# Patient Record
Sex: Female | Born: 1975 | Hispanic: Yes | Marital: Married | Smoking: Never smoker
Health system: Southern US, Community
[De-identification: ages and names within clinical notes are randomized; demographics above are authoritative.]

## PROBLEM LIST (undated history)

## (undated) DIAGNOSIS — G4730 Sleep apnea, unspecified: Secondary | ICD-10-CM

## (undated) DIAGNOSIS — R7303 Prediabetes: Secondary | ICD-10-CM

## (undated) HISTORY — DX: Sleep apnea, unspecified: G47.30

## (undated) HISTORY — DX: Prediabetes: R73.03

---

## 2001-01-13 HISTORY — PX: MOUTH SURGERY: SHX715

## 2001-01-22 ENCOUNTER — Other Ambulatory Visit: Admission: RE | Admit: 2001-01-22 | Discharge: 2001-01-22 | Payer: Self-pay | Admitting: Obstetrics and Gynecology

## 2002-02-11 ENCOUNTER — Other Ambulatory Visit: Admission: RE | Admit: 2002-02-11 | Discharge: 2002-02-11 | Payer: Self-pay | Admitting: Obstetrics and Gynecology

## 2002-08-29 ENCOUNTER — Other Ambulatory Visit: Admission: RE | Admit: 2002-08-29 | Discharge: 2002-08-29 | Payer: Self-pay | Admitting: Obstetrics and Gynecology

## 2003-04-05 ENCOUNTER — Other Ambulatory Visit: Admission: RE | Admit: 2003-04-05 | Discharge: 2003-04-05 | Payer: Self-pay | Admitting: Obstetrics and Gynecology

## 2003-09-28 ENCOUNTER — Other Ambulatory Visit: Admission: RE | Admit: 2003-09-28 | Discharge: 2003-09-28 | Payer: Self-pay | Admitting: Obstetrics and Gynecology

## 2004-02-10 ENCOUNTER — Inpatient Hospital Stay (HOSPITAL_COMMUNITY): Admission: AD | Admit: 2004-02-10 | Discharge: 2004-02-10 | Payer: Self-pay | Admitting: Obstetrics and Gynecology

## 2004-04-24 ENCOUNTER — Other Ambulatory Visit: Admission: RE | Admit: 2004-04-24 | Discharge: 2004-04-24 | Payer: Self-pay | Admitting: Obstetrics and Gynecology

## 2004-10-15 ENCOUNTER — Other Ambulatory Visit: Admission: RE | Admit: 2004-10-15 | Discharge: 2004-10-15 | Payer: Self-pay | Admitting: Obstetrics and Gynecology

## 2007-05-23 ENCOUNTER — Inpatient Hospital Stay (HOSPITAL_COMMUNITY): Admission: AD | Admit: 2007-05-23 | Discharge: 2007-05-25 | Payer: Self-pay | Admitting: Obstetrics and Gynecology

## 2008-06-28 ENCOUNTER — Encounter: Payer: Self-pay | Admitting: Gynecology

## 2008-06-28 ENCOUNTER — Ambulatory Visit: Payer: Self-pay | Admitting: Gynecology

## 2008-06-28 ENCOUNTER — Other Ambulatory Visit: Admission: RE | Admit: 2008-06-28 | Discharge: 2008-06-28 | Payer: Self-pay | Admitting: Gynecology

## 2008-07-14 ENCOUNTER — Ambulatory Visit: Payer: Self-pay | Admitting: Gynecology

## 2008-09-01 ENCOUNTER — Ambulatory Visit: Payer: Self-pay | Admitting: Gynecology

## 2009-07-13 ENCOUNTER — Ambulatory Visit: Payer: Self-pay | Admitting: Gynecology

## 2009-07-13 ENCOUNTER — Other Ambulatory Visit: Admission: RE | Admit: 2009-07-13 | Discharge: 2009-07-13 | Payer: Self-pay | Admitting: Gynecology

## 2009-07-17 ENCOUNTER — Encounter: Admission: RE | Admit: 2009-07-17 | Discharge: 2009-07-17 | Payer: Self-pay | Admitting: Gynecology

## 2010-08-09 ENCOUNTER — Ambulatory Visit (INDEPENDENT_AMBULATORY_CARE_PROVIDER_SITE_OTHER): Payer: BC Managed Care – PPO | Admitting: Gynecology

## 2010-08-09 ENCOUNTER — Encounter: Payer: Self-pay | Admitting: Gynecology

## 2010-08-09 ENCOUNTER — Other Ambulatory Visit (HOSPITAL_COMMUNITY)
Admission: RE | Admit: 2010-08-09 | Discharge: 2010-08-09 | Disposition: A | Discharge status: Home / Self Care | Payer: BC Managed Care – PPO | Source: Ambulatory Visit | Attending: Gynecology | Admitting: Gynecology

## 2010-08-09 VITALS — BP 120/84 | Ht 61.25 in | Wt 146.0 lb

## 2010-08-09 DIAGNOSIS — Z01419 Encounter for gynecological examination (general) (routine) without abnormal findings: Secondary | ICD-10-CM | POA: Insufficient documentation

## 2010-08-09 DIAGNOSIS — Z113 Encounter for screening for infections with a predominantly sexual mode of transmission: Secondary | ICD-10-CM

## 2010-08-09 DIAGNOSIS — R82998 Other abnormal findings in urine: Secondary | ICD-10-CM

## 2010-08-09 DIAGNOSIS — N920 Excessive and frequent menstruation with regular cycle: Secondary | ICD-10-CM

## 2010-08-09 DIAGNOSIS — B373 Candidiasis of vulva and vagina: Secondary | ICD-10-CM

## 2010-08-09 LAB — RPR

## 2010-08-09 MED ORDER — FLUCONAZOLE 150 MG PO TABS: 150.0000 mg | ORAL_TABLET | Freq: Once | ORAL | Status: AC

## 2010-08-09 MED ORDER — TRANEXAMIC ACID 650 MG PO TABS: ORAL_TABLET | ORAL | Status: DC

## 2010-08-09 NOTE — Progress Notes (Signed)
Addended by: Landis Martins R on: 08/09/2010 02:18 PM   Modules accepted: Orders

## 2010-08-09 NOTE — Progress Notes (Signed)
Subjective:     Brandy Alvarez is a 35 y.o. female here for a routine exam.  Current complaints: Dysmenorrhea and menorrhagia. The patient also interested in having an STD screen.  Personal health questionnaire reviewed: yes.   Gynecologic History Patient's last menstrual period was 07/25/2010. Contraception: IUD Last Pap: 2011. Results were: normal Last mammogram: 2011. Results were: normal  Obstetric History OB History    Grav Para Term Preterm Abortions TAB SAB Ect Mult Living   4 3   1  1   3      # Outc Date GA Lbr Len/2nd Wgt Sex Del Anes PTL Lv   1 PAR            2 PAR            3 PAR            4 SAB                The following portions of the patient's history were reviewed and updated as appropriate: allergies, current medications, past family history, past medical history, past social history, past surgical history and problem list.  Review of Systems A comprehensive review of systems was negative.    Objective:    BP 120/84  Ht 5' 1.25" (1.556 m)  Wt 146 lb (66.225 kg)  BMI 27.36 kg/m2  LMP 07/25/2010  General Appearance:    Alert, cooperative, no distress, appears stated age  Head:    Normocephalic, without obvious abnormality, atraumatic  Eyes:    PERRL, conjunctiva/corneas clear, EOM's intact, fundi    benign, both eyes  Ears:    Normal TM's and external ear canals, both ears  Nose:   Nares normal, septum midline, mucosa normal, no drainage    or sinus tenderness  Throat:   Lips, mucosa, and tongue normal; teeth and gums normal  Neck:   Supple, symmetrical, trachea midline, no adenopathy;    thyroid:  no enlargement/tenderness/nodules; no carotid   bruit or JVD  Back:     Symmetric, no curvature, ROM normal, no CVA tenderness  Lungs:     Clear to auscultation bilaterally, respirations unlabored  Chest Wall:    No tenderness or deformity   Heart:    Regular rate and rhythm, S1 and S2 normal, no murmur, rub   or gallop  Breast Exam:    No  tenderness, masses, or nipple abnormality  Abdomen:     Soft, non-tender, bowel sounds active all four quadrants,    no masses, no organomegaly  Genitalia:    Normal female without lesion, discharge or tenderness  Rectal:    Normal tone, normal prostate, no masses or tenderness;   guaiac negative stool  Extremities:   Extremities normal, atraumatic, no cyanosis or edema  Pulses:   2+ and symmetric all extremities  Skin:   Skin color, texture, turgor normal, no rashes or lesions  Lymph nodes:   Cervical, supraclavicular, and axillary nodes normal  Neurologic:   CNII-XII intact, normal strength, sensation and reflexes    throughout      Assessment:    Healthy female exam.the patient underwent an STD screen as follows: HIV, RPR, hepatitis B surface antigen, wet prep, GC and chlamydia culture. The wet prep demonstrated evidence of yeast infection. Prescription for Diflucan 150 mg to take one by mouth today was prescribed. For her dysmenorrhea and menorrhagia she was given a prescription for lysteda 650 mg 2 tablets 3 times a day for 5 days  with every cycle. We'll notify her if there is any abnormality of the above-mentioned test. A CBC urinalysis and Pap smear was done as well.     Plan:       as per assessment above. Patient to schedule her mammogram. She has an IUD ParaGard was placed 2 years ago. Return to the office when necessary or in one year for her annual exam.

## 2010-08-15 ENCOUNTER — Other Ambulatory Visit: Payer: Self-pay | Admitting: Gynecology

## 2010-08-15 DIAGNOSIS — Z1231 Encounter for screening mammogram for malignant neoplasm of breast: Secondary | ICD-10-CM

## 2010-09-03 ENCOUNTER — Ambulatory Visit
Admission: RE | Admit: 2010-09-03 | Discharge: 2010-09-03 | Disposition: A | Discharge status: Home / Self Care | Payer: BC Managed Care – PPO | Source: Ambulatory Visit | Attending: Gynecology | Admitting: Gynecology

## 2010-09-03 DIAGNOSIS — Z1231 Encounter for screening mammogram for malignant neoplasm of breast: Secondary | ICD-10-CM

## 2012-02-24 ENCOUNTER — Ambulatory Visit (INDEPENDENT_AMBULATORY_CARE_PROVIDER_SITE_OTHER): Payer: BC Managed Care – PPO | Admitting: Gynecology

## 2012-02-24 ENCOUNTER — Other Ambulatory Visit (HOSPITAL_COMMUNITY)
Admission: RE | Admit: 2012-02-24 | Discharge: 2012-02-24 | Disposition: A | Discharge status: Home / Self Care | Payer: BC Managed Care – PPO | Source: Ambulatory Visit | Attending: Gynecology | Admitting: Gynecology

## 2012-02-24 ENCOUNTER — Encounter: Payer: Self-pay | Admitting: Gynecology

## 2012-02-24 VITALS — BP 140/92 | Ht 61.0 in | Wt 162.0 lb

## 2012-02-24 DIAGNOSIS — Z1151 Encounter for screening for human papillomavirus (HPV): Secondary | ICD-10-CM | POA: Insufficient documentation

## 2012-02-24 DIAGNOSIS — R635 Abnormal weight gain: Secondary | ICD-10-CM

## 2012-02-24 DIAGNOSIS — Z30432 Encounter for removal of intrauterine contraceptive device: Secondary | ICD-10-CM

## 2012-02-24 DIAGNOSIS — Z833 Family history of diabetes mellitus: Secondary | ICD-10-CM | POA: Insufficient documentation

## 2012-02-24 DIAGNOSIS — Z01419 Encounter for gynecological examination (general) (routine) without abnormal findings: Secondary | ICD-10-CM

## 2012-02-24 LAB — CBC WITH DIFFERENTIAL/PLATELET
Basophils Absolute: 0 10*3/uL (ref 0.0–0.1)
Eosinophils Absolute: 0.3 10*3/uL (ref 0.0–0.7)
Eosinophils Relative: 3 % (ref 0–5)
HCT: 39.4 % (ref 36.0–46.0)
Lymphocytes Relative: 28 % (ref 12–46)
MCH: 28.8 pg (ref 26.0–34.0)
MCV: 84 fL (ref 78.0–100.0)
Monocytes Absolute: 0.5 10*3/uL (ref 0.1–1.0)
Platelets: 310 10*3/uL (ref 150–400)
RDW: 14 % (ref 11.5–15.5)
WBC: 9.8 10*3/uL (ref 4.0–10.5)

## 2012-02-24 LAB — LIPID PANEL
Cholesterol: 94 mg/dL (ref 0–200)
LDL Cholesterol: 20 mg/dL (ref 0–99)
Total CHOL/HDL Ratio: 3.2 Ratio
Triglycerides: 224 mg/dL — ABNORMAL HIGH (ref <150)
VLDL: 45 mg/dL — ABNORMAL HIGH (ref 0–40)

## 2012-02-24 NOTE — Progress Notes (Signed)
Brandy Alvarez Nov 07, 1975 [id number]   History:    37 y.o.  for annual gyn exam who has not been seen in the office since 2010. Patient remarried and wants to have children with her new spouse. Patient like to have her ParaGard T380A IUD removed. She states she has gained some weight. Her menstrual cycles otherwise regular. She had a baseline mammogram at age 42 which was normal. Patient's flu vaccine and Tdap vaccine are up-to-date. Patient with no history of abnormal Pap smears. Patient does her monthly self breast examination.  Past medical history,surgical history, family history and social history were all reviewed and documented in the EPIC chart.  Gynecologic History Patient's last menstrual period was 02/07/2012. Contraception: IUD Last Pap: 2010. Results were: normal Last mammogram: age 53. Results were: normal  Obstetric History OB History   Grav Para Term Preterm Abortions TAB SAB Ect Mult Living   4 3   1  1   3      # Outc Date GA Lbr Len/2nd Wgt Sex Del Anes PTL Lv   1 PAR            2 PAR            3 PAR            4 SAB                ROS: A ROS was performed and pertinent positives and negatives are included in the history.  GENERAL: No fevers or chills. HEENT: No change in vision, no earache, sore throat or sinus congestion. NECK: No pain or stiffness. CARDIOVASCULAR: No chest pain or pressure. No palpitations. PULMONARY: No shortness of breath, cough or wheeze. GASTROINTESTINAL: No abdominal pain, nausea, vomiting or diarrhea, melena or bright red blood per rectum. GENITOURINARY: No urinary frequency, urgency, hesitancy or dysuria. MUSCULOSKELETAL: No joint or muscle pain, no back pain, no recent trauma. DERMATOLOGIC: No rash, no itching, no lesions. ENDOCRINE: No polyuria, polydipsia, no heat or cold intolerance. No recent change in weight. HEMATOLOGICAL: No anemia or easy bruising or bleeding. NEUROLOGIC: No headache, seizures, numbness, tingling or weakness.  PSYCHIATRIC: No depression, no loss of interest in normal activity or change in sleep pattern.     Exam: chaperone present  BP 140/92  Ht 5\' 1"  (1.549 m)  Wt 162 lb (73.483 kg)  BMI 30.63 kg/m2  LMP 02/07/2012  Body mass index is 30.63 kg/(m^2).  General appearance : Well developed well nourished female. No acute distress HEENT: Neck supple, trachea midline, no carotid bruits, no thyroidmegaly Lungs: Clear to auscultation, no rhonchi or wheezes, or rib retractions  Heart: Regular rate and rhythm, no murmurs or gallops Breast:Examined in sitting and supine position were symmetrical in appearance, no palpable masses or tenderness,  no skin retraction, no nipple inversion, no nipple discharge, no skin discoloration, no axillary or supraclavicular lymphadenopathy Abdomen: no palpable masses or tenderness, no rebound or guarding Extremities: no edema or skin discoloration or tenderness  Pelvic:  Bartholin, Urethra, Skene Glands: Within normal limits             Vagina: No gross lesions or discharge, IUD string seen  Cervix: No gross lesions or discharge  Uterus  anteverted, normal size, shape and consistency, non-tender and mobile  Adnexa  Without masses or tenderness  Anus and perineum  normal   Rectovaginal  normal sphincter tone without palpated masses or tenderness             Hemoccult not  done   After the Pap smear was obtained and the cervix was cleansed with Betadine solution and a [place] clamp was used to grasp the IUD string. The IUD was retrieved shown to the patient and discarded.  Assessment/Plan:  37 y.o. female for annual exam who requested to have her ParaGard T380A IUD removed which it was. Patient will be started on prenatal vitamins. We discussed timing of intercourse by the utilization of ovulation predictor kit. We discussed the risk of advanced maternal age and pregnancy to include hypertension, diabetes, premature delivery, intrauterine fetal demise, and birth  defects. The following labs were ordered today: Fasting blood sugar, CBC, fasting lipid profile, urinalysis and TSH. Patient was reminded to do her monthly self breast examination.    Ok Edwards MD, 12:35 PM 02/24/2012

## 2012-02-24 NOTE — Patient Instructions (Addendum)
Media planner (Pregnancy) Si planea quedar embarazada, es una buena idea concertar una cita de preconcepcin con el mdico para poder lograr un estilo de vida saludable ante de quedar embarazada. Esto incluye dieta, peso, ejercicio, el tomar vitaminas prenatales en especial cido flico (ayuda a prevenir defectos en el cerebro y la mdula espinal), evitar el alcohol, fumar, las drogas ilegales, problemas mdicos (diabetes, convulsiones), historial familiar de problemas genticos, condiciones de trabajo e inmunizaciones. Es mejor tener conocimiento de estas cosas y Field seismologist algo antes de quedar embarazada. Si est embarazada, es necesario que siga ciertas pautas para tener un beb sano. Es muy importante Optometrist controles prenatales adecuados y seguir las indicaciones del profesional que la asiste. La atencin prenatal incluye toda la asistencia mdica que usted recibe antes del nacimiento del beb. Esto ayuda a Risk analyst y [place]. INSTRUCCIONES PARA EL CUIDADO DOMICILIARIO  Comience las consultas prenatales alrededor de la 12 semana de embarazo o lo antes posible. Al principio generalmente se programan cada mes. Se hacen ms frecuentes en los 2 ltimos meses antes del parto. Es importante que concurra a todas las citas con el profesional y siga sus instrucciones con Engineer, site a los medicamentos que deba Risk manager, a la actividad fsica y a Engineer, technical sales.  Durante el embarazo debe obtener nutrientes para usted y para su beb. Consuma una dieta normal y bien balanceada. Elija alimentos como carne, pescado, [place] y otros productos lcteos, vegetales, frutas, panes integrales y Therapist, art cul es el aumento de [place] ideal, segn su peso y Chief Strategy Officer. Beba gran cantidad de lquidos. Trate de beber 8 vasos de lquidos por Training and development officer.  El alcohol se asocia a cierto nmero de defectos del nacimiento, incluyendo el sndrome de alcoholismo fetal. Lo mejor es evitarlo  completamente El cigarrillo causa nacimientos prematuros y bebs de bajo peso al Associate Professor. El consumo de alcohol y nicotina durante el embarazo tambin aumentan marcadamente la probabilidad de que el nio sea qumicamente dependiente en etapas posteriores de su vida y puede contribuir al sndrome de muerte sbita infantil (SMSI)  No consuma drogas.  Solo tome medicamentos prescriptos o de venta libre que le haya recomendado el profesional. Algunos medicamentos pueden causar problemas genticos y fsicos al beb  Las nuseas matinales pueden aliviarse si come Firefighter en la cama. Coma dos galletitas antes de levantarse por la maana.  Las relaciones sexuales pueden continuarse hasta casi el final del embarazo, si no se presentan otros problemas como prdida prematura (antes de tiempo) de lquido amnitico, Social research officer, government vaginal, dolor durante las relaciones sexuales o dolor abdominal (en el vientre).  Practique ejercicios con regularidad. Consulte con el profesional que la asiste si no sabe con certeza si determinados ejercicios son seguros.  No utilice la baera con agua caliente, baos turcos y saunas. Estos aumentan el riesgo de sufrir un desmayo o de prdida del conocimiento, y as Glass blower/designer usted o el beb. La natacin es un buen ejercicio. Descanse todo lo que pueda e incluya una siesta despus de almorzar siempre que le sea posible, especialmente durante el tercer trimestre.  Evite los olores y las sustancias qumicas txicas.  No use zapatos de tacones altos, podra perder el equilibrio y caer.  No levante objetos de ms de 2,5 kg. Si levanta un objeto, flexione las piernas y los muslos, y no la espalda.  Evite los viajes largos, Art gallery manager trimestre.  Si debe viajar fuera de la ciudad o de su [place], lleve [place]  copia de la historia clnica. SOLICITE ATENCIN MDICA DE INMEDIATO SI:  La temperatura oral se eleva sin motivo por encima de 102 F (38.9 C) o  segn le indique el profesional que lo asiste.  Tiene una prdida de lquido por la vagina. Si sospecha una ruptura de las [place], tmese la temperatura y llame al profesional para informarlo sobre esto.  Observa unas pequeas manchas o una hemorragia vaginal Notifique al profesional acerca de la cantidad y de cuntos apsitos est utilizando.  Contina teniendo nuseas y no obtiene alivio de los [employer] han [place], o vomita sangre o una sustancia similar a la borra del caf.  Presenta un dolor en la zona superior del abdomen.  Siente molestias en el ligamento redondo en la parte abdominal baja. El profesional que la asiste Hydrologist.  Siente pequeas contracciones del tero (matriz)  No siente que el beb se mueve, o percibe menos movimientos que antes.  Siente dolor al [employer].  Brett Fairy hemorragia vaginal anormal.  Tiene diarrea persistente.  Sufre una cefalea grave.  Tiene problemas visuales.  Comienza a sentir debilidad muscular.  Se siente mareada o sufre un desmayo.  Comienza a sentir falta de aire.  Siente dolor en el pecho.  Sufre dolor en la espalda que se irradia hacia la pierna y el pie.  Siente latidos cardacos irregulares o la frecuencia cardaca es muy rpida.  Aumenta excesivamente de peso en un perodo breve (2,5 kg en 3 a 5 das)  Se ve envuelta en una situacin de violencia domstica. Document Released: 10/09/2004 Document Revised: 03/24/2011 [facility] Patient Information 2013 [place], [place]. Ovulation predictor

## 2012-02-25 LAB — URINALYSIS W MICROSCOPIC + REFLEX CULTURE
Bilirubin Urine: NEGATIVE
Casts: NONE SEEN
Crystals: NONE SEEN
Nitrite: NEGATIVE
Protein, ur: NEGATIVE mg/dL
Urobilinogen, UA: 0.2 mg/dL (ref 0.0–1.0)

## 2012-03-01 ENCOUNTER — Other Ambulatory Visit: Payer: Self-pay | Admitting: Anesthesiology

## 2012-03-01 DIAGNOSIS — E7800 Pure hypercholesterolemia, unspecified: Secondary | ICD-10-CM

## 2012-07-02 ENCOUNTER — Encounter (HOSPITAL_COMMUNITY): Payer: Self-pay | Admitting: *Deleted

## 2012-07-02 ENCOUNTER — Emergency Department (HOSPITAL_COMMUNITY)
Admission: EM | Admit: 2012-07-02 | Discharge: 2012-07-02 | Disposition: A | Discharge status: Home / Self Care | Payer: BC Managed Care – PPO | Attending: Emergency Medicine | Admitting: Emergency Medicine

## 2012-07-02 ENCOUNTER — Emergency Department (HOSPITAL_COMMUNITY): Payer: BC Managed Care – PPO

## 2012-07-02 DIAGNOSIS — Y9389 Activity, other specified: Secondary | ICD-10-CM | POA: Insufficient documentation

## 2012-07-02 DIAGNOSIS — S139XXA Sprain of joints and ligaments of unspecified parts of neck, initial encounter: Secondary | ICD-10-CM | POA: Insufficient documentation

## 2012-07-02 DIAGNOSIS — Y92410 Unspecified street and highway as the place of occurrence of the external cause: Secondary | ICD-10-CM | POA: Insufficient documentation

## 2012-07-02 DIAGNOSIS — J45909 Unspecified asthma, uncomplicated: Secondary | ICD-10-CM | POA: Insufficient documentation

## 2012-07-02 DIAGNOSIS — S161XXA Strain of muscle, fascia and tendon at neck level, initial encounter: Secondary | ICD-10-CM

## 2012-07-02 MED ORDER — HYDROCODONE-ACETAMINOPHEN 5-325 MG PO TABS: 1.0000 | ORAL_TABLET | Freq: Four times a day (QID) | ORAL | Status: DC | PRN

## 2012-07-02 MED ORDER — IBUPROFEN 800 MG PO TABS: 800.0000 mg | ORAL_TABLET | Freq: Three times a day (TID) | ORAL | Status: DC | PRN

## 2012-07-02 NOTE — ED Provider Notes (Signed)
Medical screening examination/treatment/procedure(s) were performed by non-physician practitioner and as supervising physician I was immediately available for consultation/collaboration.  Ethelda Chick, MD 07/02/12 2328
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
## 2013-11-14 ENCOUNTER — Encounter (HOSPITAL_COMMUNITY): Payer: Self-pay | Admitting: *Deleted

## 2014-03-31 ENCOUNTER — Encounter: Payer: Self-pay | Admitting: Gynecology

## 2014-04-14 ENCOUNTER — Encounter: Payer: Self-pay | Admitting: Gynecology

## 2014-04-14 ENCOUNTER — Ambulatory Visit (INDEPENDENT_AMBULATORY_CARE_PROVIDER_SITE_OTHER): Payer: 59 | Admitting: Gynecology

## 2014-04-14 VITALS — BP 118/76 | Ht 61.25 in | Wt 162.0 lb

## 2014-04-14 DIAGNOSIS — Z01419 Encounter for gynecological examination (general) (routine) without abnormal findings: Secondary | ICD-10-CM

## 2014-04-14 NOTE — Progress Notes (Signed)
Brandy Alvarez [birth year] [id number]   History:    39 y.o.  for annual gyn exam with no major complaints today. Patient was last seen in the office in 2014 at which time her Thorp IUD was removed. She reports having normal cycles every 28 days has been using no form of contraception since she is wishing to get pregnant and has not been successful. This is patient's second marriage and states that her current husband has had children with another partner in the past. She denies any prior history of STDs or any gynecological procedures. Her children were delivered vaginally. She denies any past history of any abnormal Pap smear. Her new PCP we'll be drawing her blood work next month.  Past medical history,surgical history, family history and social history were all reviewed and documented in the EPIC chart.  Gynecologic History Patient's last menstrual period was 03/31/2014. Contraception: none Last Pap: 2014. Results were: normal Last mammogram: Not indicated. Results were: Not indicated  Obstetric History OB History  Gravida Para Term Preterm AB SAB TAB Ectopic Multiple Living  _0 # Outcome Date GA Lbr Len/2nd Weight Sex Delivery Anes PTL Lv  4 SAB           3 Para           2 Para           1 Para                ROS: A ROS was performed and pertinent positives and negatives are included in the history.  GENERAL: No fevers or chills. HEENT: No change in vision, no earache, sore throat or sinus congestion. NECK: No pain or stiffness. CARDIOVASCULAR: No chest pain or pressure. No palpitations. PULMONARY: No shortness of breath, cough or wheeze. GASTROINTESTINAL: No abdominal pain, nausea, vomiting or diarrhea, melena or bright red blood per rectum. GENITOURINARY: No urinary frequency, urgency, hesitancy or dysuria. MUSCULOSKELETAL: No joint or muscle pain, no back pain, no recent trauma. DERMATOLOGIC: No rash, no itching, no lesions. ENDOCRINE: No polyuria,  polydipsia, no heat or cold intolerance. No recent change in weight. HEMATOLOGICAL: No anemia or easy bruising or bleeding. NEUROLOGIC: No headache, seizures, numbness, tingling or weakness. PSYCHIATRIC: No depression, no loss of interest in normal activity or change in sleep pattern.     Exam: chaperone present  BP 118/76 mmHg  Ht 5' 1.25" (1.556 m)  Wt 162 lb (73.483 kg)  BMI 30.35 kg/m2  LMP 03/31/2014  Body mass index is 30.35 kg/(m^2).  General appearance : Well developed well nourished female. No acute distress HEENT: Eyes: no retinal hemorrhage or exudates,  Neck supple, trachea midline, no carotid bruits, no thyroidmegaly Lungs: Clear to auscultation, no rhonchi or wheezes, or rib retractions  Heart: Regular rate and rhythm, no murmurs or gallops Breast:Examined in sitting and supine position were symmetrical in appearance, no palpable masses or tenderness,  no skin retraction, no nipple inversion, no nipple discharge, no skin discoloration, no axillary or supraclavicular lymphadenopathy Abdomen: no palpable masses or tenderness, no rebound or guarding Extremities: no edema or skin discoloration or tenderness  Pelvic:  Bartholin, Urethra, Skene Glands: Within normal limits             Vagina: No gross lesions or discharge  Cervix: No gross lesions or discharge  Uterus  anteverted, normal size, shape and consistency, non-tender and mobile  Adnexa  Without masses or tenderness  Anus and perineum  normal   Rectovaginal  normal sphincter tone without palpated masses or tenderness             Hemoccult not indicated     Assessment/Plan:  39 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 I've given her instructions to use the ovulation predictor kit for timing of intercourse and to keep a log of her menstrual cycles as well as the changes on the ovulation predictor kit as well as the timing of intercourse and to bring that to the office in 3 months if she does not  conceive. We will then need to do an HSG and a semen analysis as part of the initial evaluation for secondary infertility. Her PCP we'll be doing her blood work. Pap smear not indicated this year according to the new guidelines. We discussed importance of taking her prenatal vitamins daily.   Terrance Mass MD, 11:16 AM 04/14/2014

## 2016-05-28 ENCOUNTER — Encounter: Payer: Self-pay | Admitting: Gynecology

## 2018-09-17 ENCOUNTER — Other Ambulatory Visit: Payer: Self-pay

## 2018-09-21 ENCOUNTER — Other Ambulatory Visit: Payer: Self-pay

## 2018-09-21 ENCOUNTER — Ambulatory Visit: Payer: PRIVATE HEALTH INSURANCE | Admitting: Women's Health

## 2018-09-21 ENCOUNTER — Encounter: Payer: Self-pay | Admitting: Women's Health

## 2018-09-21 VITALS — BP 168/84 | Ht 61.0 in | Wt 179.0 lb

## 2018-09-21 DIAGNOSIS — Z01419 Encounter for gynecological examination (general) (routine) without abnormal findings: Secondary | ICD-10-CM | POA: Diagnosis not present

## 2018-09-21 NOTE — Addendum Note (Signed)
Addended by: Lorine Bears on: 09/21/2018 12:35 PM   Modules accepted: Orders

## 2018-09-21 NOTE — Patient Instructions (Addendum)
Vit D3 1000 iu daily   168/84  Health Maintenance, Female Adopting a healthy lifestyle and getting preventive care are important in promoting health and wellness. Ask your health care provider about:  The right schedule for you to have regular tests and exams.  Things you can do on your own to prevent diseases and keep yourself healthy. What should I know about diet, weight, and exercise? Eat a healthy diet   Eat a diet that includes plenty of vegetables, fruits, low-fat dairy products, and lean protein.  Do not eat a lot of foods that are high in solid fats, added sugars, or sodium. Maintain a healthy weight Body mass index (BMI) is used to identify weight problems. It estimates body fat based on height and weight. Your health care provider can help determine your BMI and help you achieve or maintain a healthy weight. Get regular exercise Get regular exercise. This is one of the most important things you can do for your health. Most adults should:  Exercise for at least 150 minutes each week. The exercise should increase your heart rate and make you sweat (moderate-intensity exercise).  Do strengthening exercises at least twice a week. This is in addition to the moderate-intensity exercise.  Spend less time sitting. Even light physical activity can be beneficial. Watch cholesterol and blood lipids Have your blood tested for lipids and cholesterol at 44 years of age, then have this test every 5 years. Have your cholesterol levels checked more often if:  Your lipid or cholesterol levels are high.  You are older than 44 years of age.  You are at high risk for heart disease. What should I know about cancer screening? Depending on your health history and family history, you may need to have cancer screening at various ages. This may include screening for:  Breast cancer.  Cervical cancer.  Colorectal cancer.  Skin cancer.  Lung cancer. What should I know about heart  disease, diabetes, and high blood pressure? Blood pressure and heart disease  High blood pressure causes heart disease and increases the risk of stroke. This is more likely to develop in people who have high blood pressure readings, are of African descent, or are overweight.  Have your blood pressure checked: ? Every 3-5 years if you are 51-81 years of age. ? Every year if you are 5 years old or older. Diabetes Have regular diabetes screenings. This checks your fasting blood sugar level. Have the screening done:  Once every three years after age 66 if you are at a normal weight and have a low risk for diabetes.  More often and at a younger age if you are overweight or have a high risk for diabetes. What should I know about preventing infection? Hepatitis B If you have a higher risk for hepatitis B, you should be screened for this virus. Talk with your health care provider to find out if you are at risk for hepatitis B infection. Hepatitis C Testing is recommended for:  Everyone born from [birth year] through 1965.  Anyone with known risk factors for hepatitis C. Sexually transmitted infections (STIs)  Get screened for STIs, including gonorrhea and chlamydia, if: ? You are sexually active and are younger than 44 years of age. ? You are older than 43 years of age and your health care provider tells you that you are at risk for this type of infection. ? Your sexual activity has changed since you were last screened, and you are at increased risk for  chlamydia or gonorrhea. Ask your health care provider if you are at risk.  Ask your health care provider about whether you are at high risk for HIV. Your health care provider may recommend a prescription medicine to help prevent HIV infection. If you choose to take medicine to prevent HIV, you should first get tested for HIV. You should then be tested every 3 months for as long as you are taking the medicine. Pregnancy  If you are about to stop  having your period (premenopausal) and you may become pregnant, seek counseling before you get pregnant.  Take 400 to 800 micrograms (mcg) of folic acid every day if you become pregnant.  Ask for birth control (contraception) if you want to prevent pregnancy. Osteoporosis and menopause Osteoporosis is a disease in which the bones lose minerals and strength with aging. This can result in bone fractures. If you are 65 years old or older, or if you are at risk for osteoporosis and fractures, ask your health care provider if you should:  Be screened for bone loss.  Take a calcium or vitamin D supplement to lower your risk of fractures.  Be given hormone replacement therapy (HRT) to treat symptoms of menopause. Follow these instructions at home: Lifestyle  Do not use any products that contain nicotine or tobacco, such as cigarettes, e-cigarettes, and chewing tobacco. If you need help quitting, ask your health care provider.  Do not use street drugs.  Do not share needles.  Ask your health care provider for help if you need support or information about quitting drugs. Alcohol use  Do not drink alcohol if: ? Your health care provider tells you not to drink. ? You are pregnant, may be pregnant, or are planning to become pregnant.  If you drink alcohol: ? Limit how much you use to 0-1 drink a day. ? Limit intake if you are breastfeeding.  Be aware of how much alcohol is in your drink. In the [place] one drink equals one 12 oz bottle of beer (355 mL), one 5 oz glass of wine (148 mL), or one 1 oz glass of hard liquor (44 mL). General instructions  Schedule regular health, dental, and eye exams.  Stay current with your vaccines.  Tell your health care provider if: ? You often feel depressed. ? You have ever been abused or do not feel safe at home. Summary  Adopting a healthy lifestyle and getting preventive care are important in promoting health and wellness.  Follow your health  care provider's instructions about healthy diet, exercising, and getting tested or screened for diseases.  Follow your health care provider's instructions on monitoring your cholesterol and blood pressure. This information is not intended to replace advice given to you by your health care provider. Make sure you discuss any questions you have with your health care provider. Document Released: 07/15/2010 Document Revised: 12/23/2017 Document Reviewed: 12/23/2017 Elsevier Patient Education  2020 Elsevier Inc.  

## 2018-09-21 NOTE — Progress Notes (Signed)
Brandy Alvarez 1975-01-21 [id number]    History:    Presents for annual exam, last here 2016 for annual exam with Dr. Toney Rakes.  Regular monthly 28-day cycle.  Husband prostate cancer with prostatectomy.  Rare intercourse.  Normal baseline mammogram at age 43 none since.  Normal Pap history.  Has annual exam scheduled with primary next week for labs.  Blood pressure elevated today, mother hypertension.  Past medical history, past surgical history, family history and social history were all reviewed and documented in the EPIC chart.  Works at orthopedic office.  3 children ages 70, 58 and 73, younger 2 have had Gardasil.  ROS:  A ROS was performed and pertinent positives and negatives are included.  Exam:  Vitals:   09/21/18 1152  BP: (!) 168/84  Weight: 179 lb (81.2 kg)  Height: 5\' 1"  (1.549 m)   Body mass index is 33.82 kg/m.   General appearance:  Normal Thyroid:  Symmetrical, normal in size, without palpable masses or nodularity. Respiratory  Auscultation:  Clear without wheezing or rhonchi Cardiovascular  Auscultation:  Regular rate, without rubs, murmurs or gallops  Edema/varicosities:  Not grossly evident Abdominal  Soft,nontender, without masses, guarding or rebound.  Liver/spleen:  No organomegaly noted  Hernia:  None appreciated  Skin  Inspection:  Grossly normal   Breasts: Examined lying and sitting.     Right: Without masses, retractions, discharge or axillary adenopathy.     Left: Without masses, retractions, discharge or axillary adenopathy. Gentitourinary   Inguinal/mons:  Normal without inguinal adenopathy  External genitalia:  Normal  BUS/Urethra/Skene's glands:  Normal  Vagina:  Normal  Cervix:  Normal  Uterus:  normal in size, shape and contour.  Midline and mobile  Adnexa/parametria:     Rt: Without masses or tenderness.   Lt: Without masses or tenderness.  Anus and perineum: Normal  Digital rectal exam: Normal sphincter tone without palpated  masses or tenderness  Assessment/Plan:  43 y.o. [place] FG4P3 for annual exam with no complaints.  Regular monthly cycles/husband prostatectomy Primary care managing labs Elevated blood pressure Obesity Asthma  Plan:.   Reviewed elevated blood pressure today, encouraged low-sodium diet, keep scheduled follow-up with primary care and check blood pressure at work which she reports has always been in the normal range.  SBEs, reviewed importance of annual screening mammogram, breast center information given instructed to schedule.  Increase regular exercise and decrease calorie/carbs.  Will get flu vaccine at work.  Pap with HR HPV typing, new screening guidelines reviewed.    [place], 11:56 AM 09/21/2018

## 2018-09-22 LAB — PAP, TP IMAGING W/ HPV RNA, RFLX HPV TYPE 16,18/45: HPV DNA High Risk: NOT DETECTED

## 2018-09-23 LAB — URINE CULTURE
MICRO NUMBER:: 860460
SPECIMEN QUALITY:: ADEQUATE

## 2018-09-23 LAB — URINALYSIS, COMPLETE W/RFL CULTURE
Bacteria, UA: NONE SEEN /HPF
Bilirubin Urine: NEGATIVE
Glucose, UA: NEGATIVE
Hgb urine dipstick: NEGATIVE
Hyaline Cast: NONE SEEN /LPF
Ketones, ur: NEGATIVE
Leukocyte Esterase: NEGATIVE
Nitrites, Initial: NEGATIVE
Protein, ur: NEGATIVE
Specific Gravity, Urine: 1.014 (ref 1.001–1.03)
pH: 6.5 (ref 5.0–8.0)

## 2018-09-23 LAB — CULTURE INDICATED

## 2019-02-17 ENCOUNTER — Other Ambulatory Visit: Payer: Self-pay | Admitting: Nurse Practitioner

## 2019-02-17 DIAGNOSIS — Z1231 Encounter for screening mammogram for malignant neoplasm of breast: Secondary | ICD-10-CM

## 2019-03-29 ENCOUNTER — Ambulatory Visit: Payer: Self-pay

## 2019-04-27 ENCOUNTER — Other Ambulatory Visit: Payer: Self-pay

## 2019-04-27 ENCOUNTER — Ambulatory Visit
Admission: RE | Admit: 2019-04-27 | Discharge: 2019-04-27 | Disposition: A | Discharge status: Home / Self Care | Payer: No Typology Code available for payment source | Source: Ambulatory Visit | Attending: Nurse Practitioner | Admitting: Nurse Practitioner

## 2019-04-27 DIAGNOSIS — Z1231 Encounter for screening mammogram for malignant neoplasm of breast: Secondary | ICD-10-CM

## 2019-04-28 ENCOUNTER — Other Ambulatory Visit: Payer: Self-pay | Admitting: Nurse Practitioner

## 2019-04-28 DIAGNOSIS — R928 Other abnormal and inconclusive findings on diagnostic imaging of breast: Secondary | ICD-10-CM

## 2019-05-05 ENCOUNTER — Ambulatory Visit
Admission: RE | Admit: 2019-05-05 | Discharge: 2019-05-05 | Disposition: A | Discharge status: Home / Self Care | Payer: No Typology Code available for payment source | Source: Ambulatory Visit | Attending: Nurse Practitioner | Admitting: Nurse Practitioner

## 2019-05-05 ENCOUNTER — Other Ambulatory Visit: Payer: Self-pay

## 2019-05-05 DIAGNOSIS — R928 Other abnormal and inconclusive findings on diagnostic imaging of breast: Secondary | ICD-10-CM

## 2019-11-29 ENCOUNTER — Ambulatory Visit: Payer: PRIVATE HEALTH INSURANCE | Admitting: Nurse Practitioner

## 2019-11-29 ENCOUNTER — Other Ambulatory Visit: Payer: Self-pay

## 2019-11-29 ENCOUNTER — Encounter: Payer: Self-pay | Admitting: Nurse Practitioner

## 2019-11-29 VITALS — BP 180/100

## 2019-11-29 DIAGNOSIS — N939 Abnormal uterine and vaginal bleeding, unspecified: Secondary | ICD-10-CM

## 2019-11-29 DIAGNOSIS — N921 Excessive and frequent menstruation with irregular cycle: Secondary | ICD-10-CM | POA: Diagnosis not present

## 2019-11-29 MED ORDER — MEGESTROL ACETATE 40 MG PO TABS: 40.0000 mg | ORAL_TABLET | Freq: Two times a day (BID) | ORAL | 0 refills | Status: AC

## 2019-11-29 NOTE — Progress Notes (Signed)
   Acute Office Visit  Subjective:    Patient ID: Brandy Alvarez, female    DOB: January 12, 1976, 44 y.o.   MRN: [id number]   HPI 44 y.o. presents today for heavy, prolonged vaginal bleeding. History of regular cycles until March 2021 where she started having more frequent cycles, sometimes twice per month. Cycle started 11/14/2019 and has been heavy with clots most days. She is still bleeding today. 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 Denies menopausal symptoms. Mother had hysterectomy for menorrhagia in her 27s.    Review of Systems  Constitutional: Negative.   Cardiovascular: Negative for palpitations.  Endocrine: Negative for cold intolerance and heat intolerance.  Genitourinary: Positive for menstrual problem and vaginal bleeding.  Neurological: Negative for dizziness and light-headedness.       Objective:    Physical Exam Constitutional:      Appearance: Normal appearance.  Genitourinary:    General: Normal vulva.     Vagina: Bleeding (with clots) present.     Cervix: Normal.     BP (!) 180/100 (BP Location: Right Arm, Patient Position: Sitting, Cuff Size: Normal)   LMP 11/14/2019  Wt Readings from Last 3 Encounters:  09/21/18 179 lb (81.2 kg)  04/14/14 162 lb (73.5 kg)  07/02/12 160 lb (72.6 kg)        Assessment & Plan:   Problem List Items Addressed This Visit      Other   Menorrhagia - Primary   Relevant Medications   megestrol (MEGACE) 40 MG tablet   Other Relevant Orders   CBC with Differential/Platelet   Comprehensive metabolic panel   TSH   US PELVIS TRANSVAGINAL NON-OB (TV ONLY)    Other Visit Diagnoses    Abnormal uterine bleeding       Relevant Medications   megestrol (MEGACE) 40 MG tablet   Other Relevant Orders   CBC with Differential/Platelet   Comprehensive metabolic panel   TSH   US PELVIS TRANSVAGINAL NON-OB (TV ONLY)      Plan: TSH, CMP, CBC.  Megace 40 mg twice a day for 10 days to slow  bleeding.  We will schedule pelvic ultrasound.  She is agreeable to plan.    Olivia Mackie [facility], 11:44 AM 11/29/2019

## 2019-11-30 ENCOUNTER — Other Ambulatory Visit: Payer: Self-pay | Admitting: Nurse Practitioner

## 2019-11-30 DIAGNOSIS — D729 Disorder of white blood cells, unspecified: Secondary | ICD-10-CM

## 2019-11-30 DIAGNOSIS — D509 Iron deficiency anemia, unspecified: Secondary | ICD-10-CM

## 2019-11-30 LAB — COMPREHENSIVE METABOLIC PANEL
AG Ratio: 1.4 (calc) (ref 1.0–2.5)
ALT: 10 U/L (ref 6–29)
AST: 17 U/L (ref 10–35)
Albumin: 4.1 g/dL (ref 3.6–5.1)
Alkaline phosphatase (APISO): 79 U/L (ref 31–125)
BUN: 10 mg/dL (ref 7–25)
CO2: 25 mmol/L (ref 20–32)
Calcium: 9.5 mg/dL (ref 8.6–10.2)
Chloride: 101 mmol/L (ref 98–110)
Creat: 0.57 mg/dL (ref 0.50–1.10)
Globulin: 3 g/dL (calc) (ref 1.9–3.7)
Glucose, Bld: 79 mg/dL (ref 65–99)
Potassium: 4 mmol/L (ref 3.5–5.3)
Sodium: 136 mmol/L (ref 135–146)
Total Bilirubin: 0.4 mg/dL (ref 0.2–1.2)
Total Protein: 7.1 g/dL (ref 6.1–8.1)

## 2019-11-30 LAB — CBC WITH DIFFERENTIAL/PLATELET
Absolute Monocytes: 634 cells/uL (ref 200–950)
Basophils Absolute: 40 cells/uL (ref 0–200)
Basophils Relative: 0.3 %
Eosinophils Absolute: 370 cells/uL (ref 15–500)
Eosinophils Relative: 2.8 %
HCT: 33.9 % — ABNORMAL LOW (ref 35.0–45.0)
Hemoglobin: 10.8 g/dL — ABNORMAL LOW (ref 11.7–15.5)
Lymphs Abs: 3392 cells/uL (ref 850–3900)
MCH: 25.8 pg — ABNORMAL LOW (ref 27.0–33.0)
MCHC: 31.9 g/dL — ABNORMAL LOW (ref 32.0–36.0)
MCV: 81.1 fL (ref 80.0–100.0)
MPV: 10.7 fL (ref 7.5–12.5)
Monocytes Relative: 4.8 %
Neutro Abs: 8765 cells/uL — ABNORMAL HIGH (ref 1500–7800)
Neutrophils Relative %: 66.4 %
Platelets: 422 10*3/uL — ABNORMAL HIGH (ref 140–400)
RBC: 4.18 10*6/uL (ref 3.80–5.10)
RDW: 13.8 % (ref 11.0–15.0)
Total Lymphocyte: 25.7 %
WBC: 13.2 10*3/uL — ABNORMAL HIGH (ref 3.8–10.8)

## 2019-11-30 LAB — TSH: TSH: 3.73 mIU/L

## 2019-11-30 MED ORDER — FERROUS SULFATE 325 (65 FE) MG PO TBEC: 325.0000 mg | DELAYED_RELEASE_TABLET | Freq: Two times a day (BID) | ORAL | 0 refills | Status: DC

## 2019-12-15 ENCOUNTER — Encounter: Payer: Self-pay | Admitting: Nurse Practitioner

## 2019-12-15 ENCOUNTER — Other Ambulatory Visit: Payer: Self-pay

## 2019-12-15 ENCOUNTER — Ambulatory Visit: Payer: PRIVATE HEALTH INSURANCE | Admitting: Nurse Practitioner

## 2019-12-15 ENCOUNTER — Ambulatory Visit (INDEPENDENT_AMBULATORY_CARE_PROVIDER_SITE_OTHER): Payer: PRIVATE HEALTH INSURANCE

## 2019-12-15 VITALS — BP 124/80

## 2019-12-15 DIAGNOSIS — N854 Malposition of uterus: Secondary | ICD-10-CM

## 2019-12-15 DIAGNOSIS — N939 Abnormal uterine and vaginal bleeding, unspecified: Secondary | ICD-10-CM

## 2019-12-15 DIAGNOSIS — N921 Excessive and frequent menstruation with irregular cycle: Secondary | ICD-10-CM | POA: Diagnosis not present

## 2019-12-15 DIAGNOSIS — R9389 Abnormal findings on diagnostic imaging of other specified body structures: Secondary | ICD-10-CM | POA: Diagnosis not present

## 2019-12-15 DIAGNOSIS — D509 Iron deficiency anemia, unspecified: Secondary | ICD-10-CM | POA: Diagnosis not present

## 2019-12-15 MED ORDER — FERROUS SULFATE 325 (65 FE) MG PO TBEC: 325.0000 mg | DELAYED_RELEASE_TABLET | Freq: Two times a day (BID) | ORAL | 0 refills | Status: DC

## 2019-12-15 MED ORDER — MEGESTROL ACETATE 40 MG PO TABS: 40.0000 mg | ORAL_TABLET | Freq: Two times a day (BID) | ORAL | 0 refills | Status: DC

## 2019-12-15 NOTE — Progress Notes (Signed)
History: 44 year old presents today for ultrasound follow-up.  Was seen by me 11/29/2019 for irregular, heavy, prolonged vaginal bleeding.  Normal cycles until March 2021, since then cycles have become more frequent sometimes twice per month.  Last cycle lasted over 2 weeks with heavy bleeding most days, Megace prescribed at that time to control bleeding.  Hemoglobin that day was 10.8, prescribed ferrous sulfate 325 twice a day but went on vacation and plans to start today.  Denies menopausal symptoms.  Normal TSH.  Exam: Appears well Ultrasound: Anteverted uterus, normal size and shape, no myometrial masses.  Thickened endometrium - 16.8 mm, cannot rule out endometrial polyp - 12 mm versus clot.  Right ovary with collapsed C.L. -14 mm, left ovary with thin-walled cyst 28 x 22 mm, avascular, echo-free, follicle = 19 mm.  No adnexal masses, no free fluid.  Assessment: Menorrhagia with irregular cycle Abnormal pelvic ultrasound - polyp vs clot Thickened endometrium - 16.8 mm Iron deficiency anemia, unspecified iron deficiency anemia type  Plan: Discussed ultrasound findings of thickened endometrium and possible endometrial polyp.  We will schedule sonohysterogram with Dr. Seymour Bars for further evaluation.  Provided her with Megace prescription in case bleeding returns prior to ultrasound.  She will start ferrous sulfate 325 mg daily.  She is agreeable to plan.

## 2019-12-15 NOTE — Patient Instructions (Signed)
Sonohysterogram  A sonohysterogram is a procedure to examine the inside of the uterus. This exam uses sound waves that are sent to a computer to make images of the lining of the uterus (endometrium). To get the best images, a germ-free, salt-water solution (sterile saline) is put into the uterus through the vagina. You may have this procedure if you have certain reproductive problems, such as abnormal bleeding, infertility, or miscarriage. This procedure can show what may be causing these problems. Possible causes include scarring or abnormal growths such as fibroids inside your uterus. It can also show if your uterus is an abnormal shape or if the lining of the uterus is too thin. Tell a health care provider about:  All medicines you are taking, including vitamins, herbs, eye drops, creams, and over-the-counter medicines.  Any allergies you have.  Any blood disorders you have.  Any surgeries you have had.  Any medical conditions you have.  Whether you are pregnant or may be pregnant.  The date of the first day of your last period.  Any signs of infection, such as fever, pain in your lower abdomen, or abnormal discharge from your vagina. What are the risks? Generally, this is a safe procedure. However, problems may occur, including:  Abdominal pain or cramping.  Light bleeding (spotting).  Increased vaginal discharge.  Infection. What happens before the procedure?  Your health care provider may have you take an over-the-counter pain medicine.  You may be given medicine to stop any abnormal bleeding.  You may be given antibiotic medicine to help prevent infection.  You may be asked to take a pregnancy test. This is usually in the form of a urine test.  You may have a pelvic exam.  You will be asked to empty your bladder. What happens during the procedure?  You will lie down on the exam table with your feet in stirrups or with your knees bent and your feet flat on the  table.  A slender, handheld device (transducer) will be lubricated and placed into your vagina.  The transducer will be positioned to send sound waves to your uterus. The sound waves are sent to a computer and are turned into images, which your health care provider sees during the procedure.  The transducer will be removed from your vagina.  An instrument will be inserted to widen the opening of your vagina (speculum).  A swab with germ-killing solution (antiseptic) will be used to clean the opening to your uterus (cervix).  A long, thin tube (catheter) will be placed through your cervix into your uterus.  The speculum will be removed.  The transducer will be placed back into your vagina to take more images.  Your uterus will be filled with a germ-free, salt-water solution (sterile saline) through the catheter. You may feel some cramping.  A fluid that contains air bubbles may be sent through the catheter to make it easier to see the fallopian tubes.  The transducer and catheter will be removed. The procedure may vary among health care providers and hospitals. What happens after the procedure?  It is up to you to get the results of your procedure. Ask your health care provider, or the department that is doing the procedure, when your results will be ready. Summary  A sonohysterogram is a procedure that creates images of the inside of the uterus.  The risks of this procedure are very low. Most women experience cramping and spotting after the procedure.  You may need to have   a pelvic exam and take a pregnancy test before this procedure. This procedure will not be done if you are pregnant or have an infection. This information is not intended to replace advice given to you by your health care provider. Make sure you discuss any questions you have with your health care provider. Document Revised: 12/12/2016 Document Reviewed: 11/26/2015 Elsevier Patient Education  2020 Elsevier  Inc.  

## 2020-01-05 ENCOUNTER — Encounter: Payer: PRIVATE HEALTH INSURANCE | Admitting: Nurse Practitioner

## 2020-01-24 ENCOUNTER — Ambulatory Visit: Payer: PRIVATE HEALTH INSURANCE | Admitting: Obstetrics & Gynecology

## 2020-01-24 ENCOUNTER — Ambulatory Visit (INDEPENDENT_AMBULATORY_CARE_PROVIDER_SITE_OTHER): Payer: PRIVATE HEALTH INSURANCE

## 2020-01-24 ENCOUNTER — Encounter: Payer: Self-pay | Admitting: Obstetrics & Gynecology

## 2020-01-24 ENCOUNTER — Ambulatory Visit (INDEPENDENT_AMBULATORY_CARE_PROVIDER_SITE_OTHER): Payer: PRIVATE HEALTH INSURANCE | Admitting: Obstetrics & Gynecology

## 2020-01-24 ENCOUNTER — Other Ambulatory Visit: Payer: Self-pay

## 2020-01-24 DIAGNOSIS — R9389 Abnormal findings on diagnostic imaging of other specified body structures: Secondary | ICD-10-CM

## 2020-01-24 DIAGNOSIS — D509 Iron deficiency anemia, unspecified: Secondary | ICD-10-CM | POA: Diagnosis not present

## 2020-01-24 DIAGNOSIS — N921 Excessive and frequent menstruation with irregular cycle: Secondary | ICD-10-CM | POA: Diagnosis not present

## 2020-01-24 MED ORDER — NORETHINDRONE 0.35 MG PO TABS: 1.0000 | ORAL_TABLET | Freq: Every day | ORAL | 4 refills | Status: DC

## 2020-01-24 NOTE — Progress Notes (Signed)
Brandy Alvarez [birth year] [id number]        45 y.o.  [id number] Married.  Husband with Prostate Ca.  RP: Menometrorrhagia for SonoHysterogram  HPI: Menometrorrhagia x May 2021.  Heavy bleeding controled on Megace.  Pelvic US 12/15/2019 No Fibroids.  Thick Endometrium, possible endometrial Polyp.   OB History  Gravida Para Term Preterm AB Living  4 3     1 3   SAB IAB Ectopic Multiple Live Births  1            # Outcome Date GA Lbr Len/2nd Weight Sex Delivery Anes PTL Lv  4 SAB           3 Para           2 Para           1 Para             Past medical history,surgical history, problem list, medications, allergies, family history and social history were all reviewed and documented in the EPIC chart.   Directed ROS with pertinent positives and negatives documented in the history of present illness/assessment and plan.  Exam:  There were no vitals filed for this visit. General appearance:  Normal                                                                    Sono Infusion Hysterogram ( procedure note)   The initial transvaginal ultrasound demonstrated the following: T/V images.  Anteverted uterus normal in size and shape with no myometrial mass.  The endometrial thickness is measured at 10.34 mm.  Left ovary normal.  Right ovary with a small simple follicle which is avascular measured at 2.5 cm.  No adnexal mass.  No free fluid in the posterior cul-de-sac.  The speculum  was inserted and the cervix cleansed with Betadine solution after confirming that patient has no allergies.A small sonohysterography catheterwas utilized.  Insertion was facilitated with ring forceps, using a spear-like motion the catheter was inserted to the fundus of the uterus. The speculum is then removed carefully to avoid dislodging the catheter. The catheter was flushed with sterile saline delete prior to insertion to rid it of small amounts of air.the sterile saline solution was infused into the  uterine cavity as a vaginal ultrasound probe was then placed in the vagina for full visualization of the uterine cavity from a transvaginal approach. The following was noted: After distention of the intrauterine cavity with saline, no intracavitary defect is seen.  The catheter was then removed after retrieving some of the saline from the intrauterine cavity. An endometrial biopsy was not done. Patient tolerated procedure well. She had received a tablet of Aleve for discomfort.    Assessment/Plan:  45 y.o. [id number]   1. Menorrhagia with irregular cycle Menometrorrhagia controlled with Megace.  Sonohysterogram today showed no intracavitary defect.  Patient's menometrorrhagia is probably due to oligoovulation of perimenopause.  Decision to control with the progestin only birth control pill.  No contraindication.  Usage reviewed and prescription sent to pharmacy.  2. Thickened endometrium Normal endometrium.  No intracavitary lesion on sonohysterogram.  3. Iron deficiency anemia, unspecified iron deficiency anemia type We will control heavy bleeding with the progestin only birth control pill.  Patient will  continue on iron supplements.  Other orders - norethindrone (MICRONOR) 0.35 MG tablet; Take 1 tablet (0.35 mg total) by mouth daily.  Genia Del MD, 2:34 PM 01/24/2020

## 2020-02-07 ENCOUNTER — Encounter: Payer: PRIVATE HEALTH INSURANCE | Admitting: Nurse Practitioner

## 2020-02-28 ENCOUNTER — Ambulatory Visit: Payer: PRIVATE HEALTH INSURANCE | Admitting: Nurse Practitioner

## 2020-06-25 ENCOUNTER — Other Ambulatory Visit: Payer: Self-pay | Admitting: Nurse Practitioner

## 2020-06-25 DIAGNOSIS — Z1231 Encounter for screening mammogram for malignant neoplasm of breast: Secondary | ICD-10-CM

## 2020-06-29 ENCOUNTER — Other Ambulatory Visit: Payer: Self-pay

## 2020-06-29 ENCOUNTER — Ambulatory Visit
Admission: RE | Admit: 2020-06-29 | Discharge: 2020-06-29 | Disposition: A | Discharge status: Home / Self Care | Payer: No Typology Code available for payment source | Source: Ambulatory Visit | Attending: Nurse Practitioner | Admitting: Nurse Practitioner

## 2020-06-29 DIAGNOSIS — Z1231 Encounter for screening mammogram for malignant neoplasm of breast: Secondary | ICD-10-CM

## 2020-07-09 ENCOUNTER — Ambulatory Visit (INDEPENDENT_AMBULATORY_CARE_PROVIDER_SITE_OTHER): Payer: No Typology Code available for payment source | Admitting: Obstetrics & Gynecology

## 2020-07-09 ENCOUNTER — Encounter: Payer: Self-pay | Admitting: Obstetrics & Gynecology

## 2020-07-09 ENCOUNTER — Other Ambulatory Visit (HOSPITAL_COMMUNITY)
Admission: RE | Admit: 2020-07-09 | Discharge: 2020-07-09 | Disposition: A | Discharge status: Home / Self Care | Payer: No Typology Code available for payment source | Source: Ambulatory Visit | Attending: Obstetrics & Gynecology | Admitting: Obstetrics & Gynecology

## 2020-07-09 ENCOUNTER — Other Ambulatory Visit: Payer: Self-pay

## 2020-07-09 VITALS — BP 130/84 | HR 87 | Resp 16 | Ht 61.0 in | Wt 186.0 lb

## 2020-07-09 DIAGNOSIS — E6609 Other obesity due to excess calories: Secondary | ICD-10-CM

## 2020-07-09 DIAGNOSIS — N920 Excessive and frequent menstruation with regular cycle: Secondary | ICD-10-CM

## 2020-07-09 DIAGNOSIS — Z01419 Encounter for gynecological examination (general) (routine) without abnormal findings: Secondary | ICD-10-CM | POA: Diagnosis present

## 2020-07-09 DIAGNOSIS — Z6835 Body mass index (BMI) 35.0-35.9, adult: Secondary | ICD-10-CM | POA: Diagnosis not present

## 2020-07-09 MED ORDER — NORETHINDRONE 0.35 MG PO TABS: 1.0000 | ORAL_TABLET | Freq: Every day | ORAL | 4 refills | Status: DC

## 2020-07-09 NOTE — Progress Notes (Signed)
Elberta Lachapelle 1975-02-27 [id number]   History:    45 y.o. [id number]  RP:  Established patient presenting for annual gyn exam   HPI: Well on the Progestin-only BCPs to control heavy periods.  Pelvic US 12/15/2019, normal endometrial line, no Fibroids.  No pelvic pain.  No pain with IC.  Breasts normal.  Screening mammo neg 06/2020.  Mother with recent Dx of Breast Ca.  BMI 35.14.  Health labs with Fam MD.  Will schedule a screening colono.    Past medical history,surgical history, family history and social history were all reviewed and documented in the EPIC chart.  Gynecologic History Patient's last menstrual period was 06/21/2020 (exact date).  Obstetric History OB History  Gravida Para Term Preterm AB Living  4 3     1 3   SAB IAB Ectopic Multiple Live Births  1            # Outcome Date GA Lbr Len/2nd Weight Sex Delivery Anes PTL Lv  4 SAB           3 Para           2 Para           1 Para              ROS: A ROS was performed and pertinent positives and negatives are included in the history.  GENERAL: No fevers or chills. HEENT: No change in vision, no earache, sore throat or sinus congestion. NECK: No pain or stiffness. CARDIOVASCULAR: No chest pain or pressure. No palpitations. PULMONARY: No shortness of breath, cough or wheeze. GASTROINTESTINAL: No abdominal pain, nausea, vomiting or diarrhea, melena or bright red blood per rectum. GENITOURINARY: No urinary frequency, urgency, hesitancy or dysuria. MUSCULOSKELETAL: No joint or muscle pain, no back pain, no recent trauma. DERMATOLOGIC: No rash, no itching, no lesions. ENDOCRINE: No polyuria, polydipsia, no heat or cold intolerance. No recent change in weight. HEMATOLOGICAL: No anemia or easy bruising or bleeding. NEUROLOGIC: No headache, seizures, numbness, tingling or weakness. PSYCHIATRIC: No depression, no loss of interest in normal activity or change in sleep pattern.     Exam:   BP 130/84   Pulse 87   Resp 16   Ht  5\' 1"  (1.549 m)   Wt 186 lb (84.4 kg)   LMP 06/21/2020 (Exact Date)   BMI 35.14 kg/m   Body mass index is 35.14 kg/m.  General appearance : Well developed well nourished female. No acute distress HEENT: Eyes: no retinal hemorrhage or exudates,  Neck supple, trachea midline, no carotid bruits, no thyroidmegaly Lungs: Clear to auscultation, no rhonchi or wheezes, or rib retractions  Heart: Regular rate and rhythm, no murmurs or gallops Breast:Examined in sitting and supine position were symmetrical in appearance, no palpable masses or tenderness,  no skin retraction, no nipple inversion, no nipple discharge, no skin discoloration, no axillary or supraclavicular lymphadenopathy Abdomen: no palpable masses or tenderness, no rebound or guarding Extremities: no edema or skin discoloration or tenderness  Pelvic: Vulva: Normal             Vagina: No gross lesions or discharge  Cervix: No gross lesions or discharge.  Pap reflex done.  Uterus  AV, normal size, shape and consistency, non-tender and mobile  Adnexa  Without masses or tenderness  Anus: Normal   Assessment/Plan:  45 y.o. female for annual exam   1. Encounter for routine gynecological examination with Papanicolaou smear of cervix Normal gynecologic exam.  Pap reflex  done.  Breast exam normal.  Screening mammogram June 2022 was negative.  We will schedule a screening colonoscopy now.  Health labs with family nurse practitioner. - Cytology - PAP( [facility])  2. Menorrhagia with regular cycle Heavy menstrual periods controlled with the progestin only birth control pill.  No contraindication to continue.  Prescription sent to pharmacy.  3. Class 2 obesity due to excess calories without serious comorbidity with body mass index (BMI) of 35.0 to 35.9 in adult Lower calorie/carb diet recommended.  Intermittent fasting suggested.  Aerobic activities 5 times a week and light weightlifting every 2 days.  Other orders - Multiple Vitamin  (MULTIVITAMIN PO); Take by mouth. - norethindrone (MICRONOR) 0.35 MG tablet; Take 1 tablet (0.35 mg total) by mouth daily.   Genia Del MD, 2:42 PM 07/09/2020

## 2020-07-10 LAB — CYTOLOGY - PAP: Diagnosis: NEGATIVE

## 2021-07-10 ENCOUNTER — Other Ambulatory Visit: Payer: Self-pay | Admitting: Nurse Practitioner

## 2021-07-10 DIAGNOSIS — Z1231 Encounter for screening mammogram for malignant neoplasm of breast: Secondary | ICD-10-CM

## 2021-07-11 ENCOUNTER — Ambulatory Visit
Admission: RE | Admit: 2021-07-11 | Discharge: 2021-07-11 | Disposition: A | Discharge status: Home / Self Care | Payer: No Typology Code available for payment source | Source: Ambulatory Visit | Attending: Nurse Practitioner | Admitting: Nurse Practitioner

## 2021-07-11 DIAGNOSIS — Z1231 Encounter for screening mammogram for malignant neoplasm of breast: Secondary | ICD-10-CM

## 2021-07-15 ENCOUNTER — Other Ambulatory Visit: Payer: Self-pay | Admitting: Nurse Practitioner

## 2021-07-15 DIAGNOSIS — R928 Other abnormal and inconclusive findings on diagnostic imaging of breast: Secondary | ICD-10-CM

## 2021-07-23 ENCOUNTER — Ambulatory Visit
Admission: RE | Admit: 2021-07-23 | Discharge: 2021-07-23 | Disposition: A | Discharge status: Home / Self Care | Payer: No Typology Code available for payment source | Source: Ambulatory Visit | Attending: Nurse Practitioner | Admitting: Nurse Practitioner

## 2021-07-23 DIAGNOSIS — R928 Other abnormal and inconclusive findings on diagnostic imaging of breast: Secondary | ICD-10-CM

## 2021-09-19 ENCOUNTER — Other Ambulatory Visit: Payer: Self-pay | Admitting: Obstetrics & Gynecology

## 2021-10-01 NOTE — Telephone Encounter (Signed)
10/01/21: Melton Alar, CMA Left message to call and schedule

## 2021-10-01 NOTE — Telephone Encounter (Signed)
Msg sent to scheduling to contact pt to set up annual exam.

## 2021-10-07 NOTE — Telephone Encounter (Signed)
10/04/21: Brandy Alvarez D, CMA Two messages left to call and schedule appointment.

## 2022-07-10 ENCOUNTER — Other Ambulatory Visit: Payer: Self-pay | Admitting: Nurse Practitioner

## 2022-07-10 DIAGNOSIS — Z1231 Encounter for screening mammogram for malignant neoplasm of breast: Secondary | ICD-10-CM

## 2022-07-25 ENCOUNTER — Ambulatory Visit
Admission: RE | Admit: 2022-07-25 | Discharge: 2022-07-25 | Disposition: A | Discharge status: Home / Self Care | Payer: No Typology Code available for payment source | Source: Ambulatory Visit | Attending: Nurse Practitioner | Admitting: Nurse Practitioner

## 2022-07-25 DIAGNOSIS — Z1231 Encounter for screening mammogram for malignant neoplasm of breast: Secondary | ICD-10-CM

## 2022-08-14 ENCOUNTER — Encounter: Payer: Self-pay | Admitting: Radiology

## 2022-08-14 ENCOUNTER — Ambulatory Visit (INDEPENDENT_AMBULATORY_CARE_PROVIDER_SITE_OTHER): Payer: No Typology Code available for payment source | Admitting: Radiology

## 2022-08-14 VITALS — BP 138/84 | Ht 61.0 in | Wt 184.0 lb

## 2022-08-14 DIAGNOSIS — Z01419 Encounter for gynecological examination (general) (routine) without abnormal findings: Secondary | ICD-10-CM

## 2022-08-14 DIAGNOSIS — E669 Obesity, unspecified: Secondary | ICD-10-CM | POA: Diagnosis not present

## 2022-08-14 NOTE — Progress Notes (Signed)
   Brandy Alvarez 11-07-1975 [id number]   History:  47 y.o. G4P3 presents for annual exam. Struggling to lose weight with diet and exercise. Prediabetic with sleep apnea. Periods have been scant this past year.LMP 03/14/19 very light.  Gynecologic History Patient's last menstrual period was 03/14/2022 (exact date). Period Duration (Days): 2 Period Pattern: (!) Irregular Menstrual Flow: Light Menstrual Control: Thin pad Dysmenorrhea: None Contraception/Family planning: none Sexually active: yes Last Pap: 2022. Results were: normal Last mammogram: 2024. Results were: normal  Obstetric History OB History  Gravida Para Term Preterm AB Living  4 3     1 3   SAB IAB Ectopic Multiple Live Births  1            # Outcome Date GA Lbr Len/2nd Weight Sex Type Anes PTL Lv  4 SAB           3 Para           2 Para           1 Para              The following portions of the patient's history were reviewed and updated as appropriate: allergies, current medications, past family history, past medical history, past social history, past surgical history, and problem list.  Review of Systems Pertinent items noted in HPI and remainder of comprehensive ROS otherwise negative.   Past medical history, past surgical history, family history and social history were all reviewed and documented in the EPIC chart.   Exam:  Vitals:   08/14/22 0753  BP: 138/84  Weight: 184 lb (83.5 kg)  Height: 5\' 1"  (1.549 m)   Body mass index is 34.77 kg/m.  General appearance:  Normal, obese Thyroid:  Symmetrical, normal in size, without palpable masses or nodularity. Respiratory  Auscultation:  Clear without wheezing or rhonchi Cardiovascular  Auscultation:  Regular rate, without rubs, murmurs or gallops  Edema/varicosities:  Not grossly evident Abdominal  Soft,nontender, without masses, guarding or rebound.  Liver/spleen:  No organomegaly noted  Hernia:  None appreciated  Skin  Inspection:  Grossly  normal Breasts: Examined lying and sitting.   Right: Without masses, retractions, nipple discharge or axillary adenopathy.   Left: Without masses, retractions, nipple discharge or axillary adenopathy. Genitourinary   Inguinal/mons:  Normal without inguinal adenopathy  External genitalia:  Normal appearing vulva with no masses, tenderness, or lesions  BUS/Urethra/Skene's glands:  Normal without masses or exudate  Vagina:  Normal appearing with normal color and discharge, no lesions  Cervix:  Normal appearing without discharge or lesions  Uterus:  Normal in size, shape and contour.  Mobile, nontender  Adnexa/parametria:     Rt: Normal in size, without masses or tenderness.   Lt: Normal in size, without masses or tenderness.  Anus and perineum: Normal   Raynelle Fanning, CMA present for exam  Assessment/Plan:   1. Well woman exam with routine gynecological exam Pap up to date Mammo due 2025 Colonoscopy up to date Labs with PCP  2. Obesity (BMI 30.0-34.9) Continue diet and exercise Trying to reverse pre diabetes and sleep apnea Will check with insurance to see if her insurance will cover a GLP-1     Discussed SBE, colonoscopy and pap screening as directed/appropriate. Recommend of exercise weekly, including weight bearing exercise. Encouraged the use of seatbelts and sunscreen. Return in 1 year for annual or as needed.   Arlie Solomons B WHNP-BC 8:09 AM 08/14/2022

## 2023-01-13 IMAGING — MG MM DIGITAL SCREENING BILAT W/ TOMO AND CAD
6 of 10 series · 6 of 30 positions shown · non-contrast
Comparison: Previous exam(s).

CLINICAL DATA: Screening.

EXAM:
DIGITAL SCREENING BILATERAL MAMMOGRAM WITH TOMOSYNTHESIS AND CAD
TECHNIQUE: Bilateral screening digital craniocaudal and mediolateral oblique
mammograms were obtained. Bilateral screening digital breast
tomosynthesis was performed. The images were evaluated with
computer-aided detection.

[L CC synth-2D]
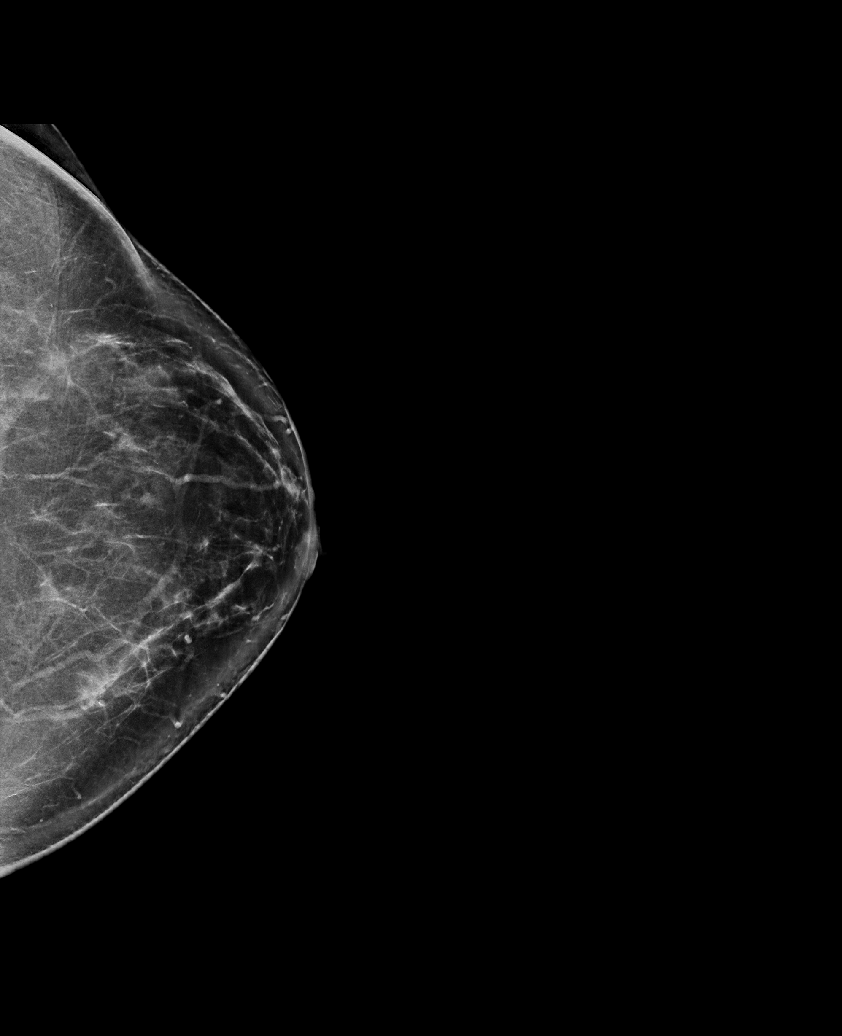

[R CC synth-2D]
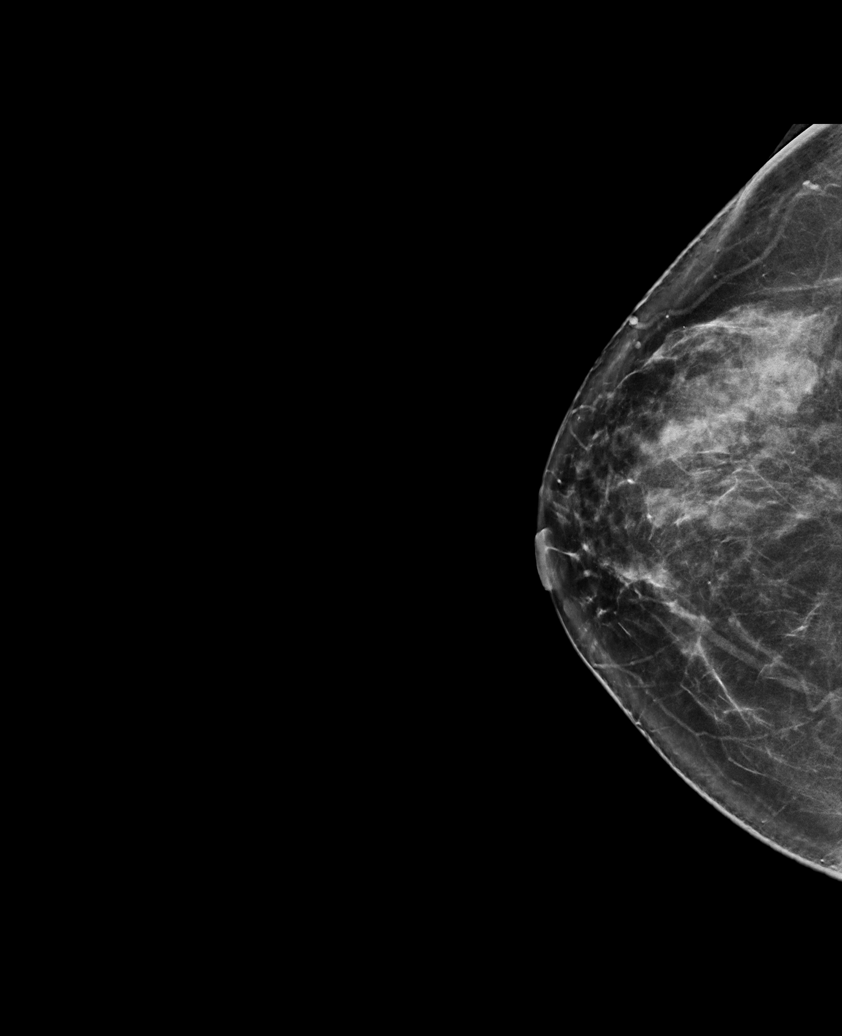

[R MLO synth-2D (1 of 2)]
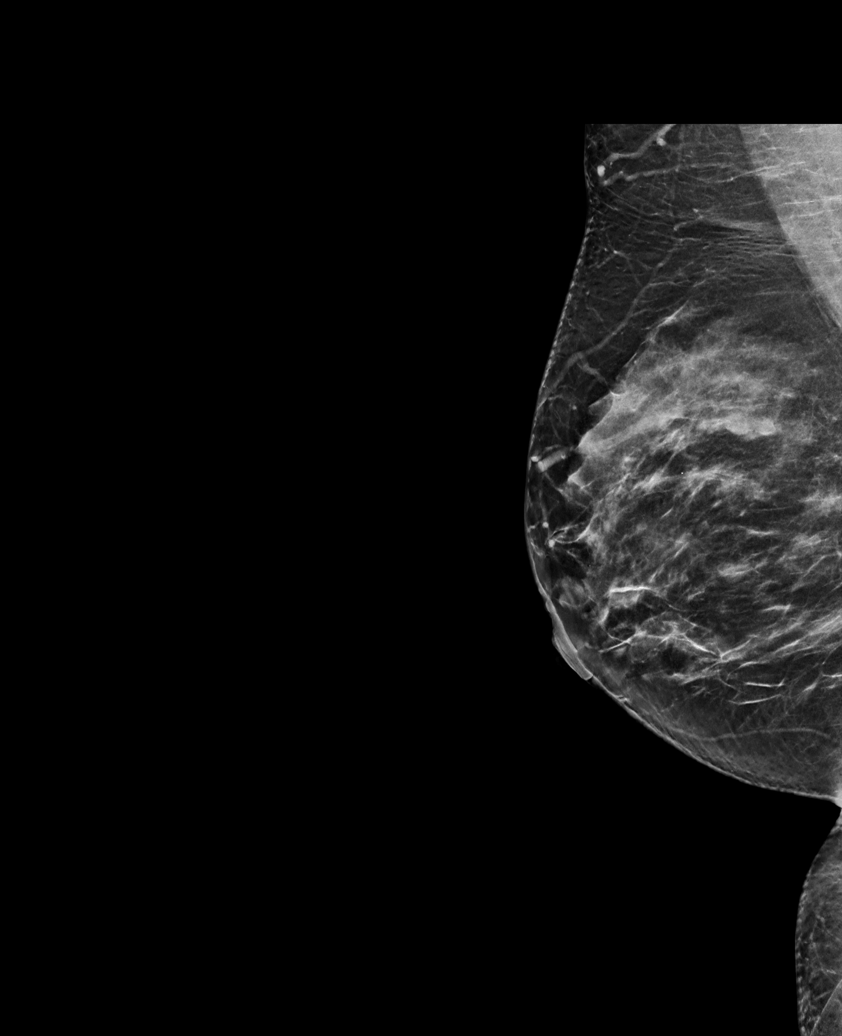

[R MLO synth-2D (2 of 2)]
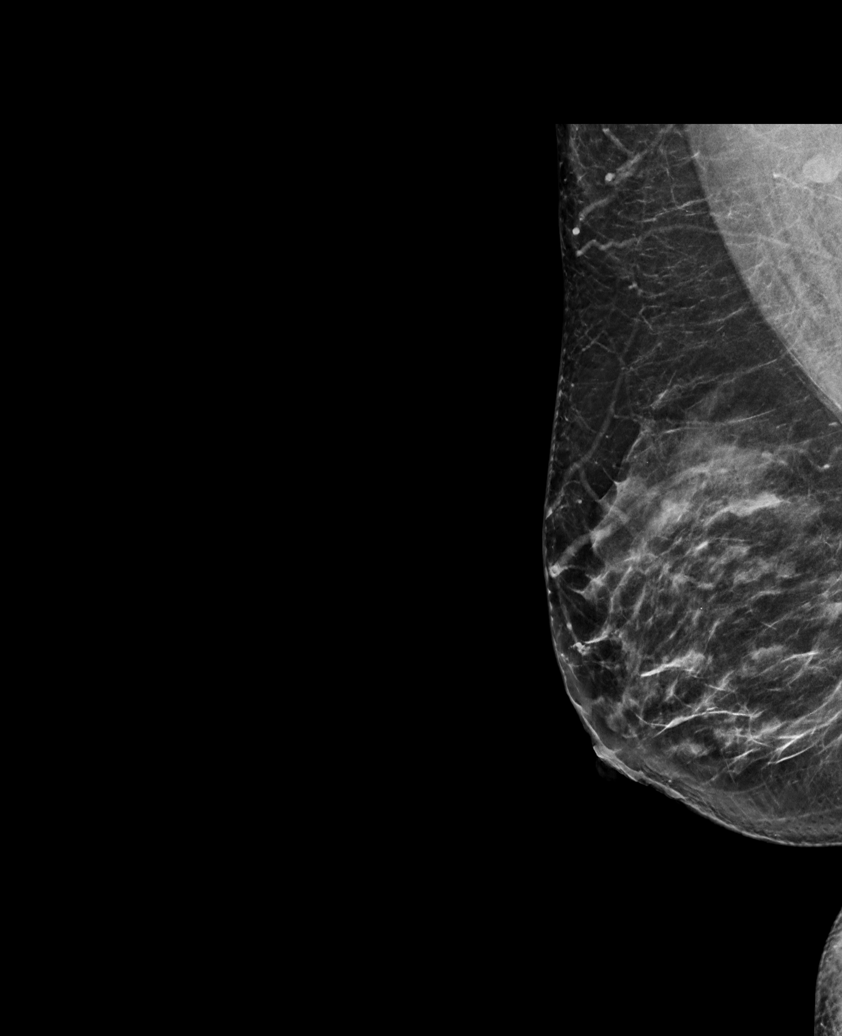

[L MLO synth-2D]
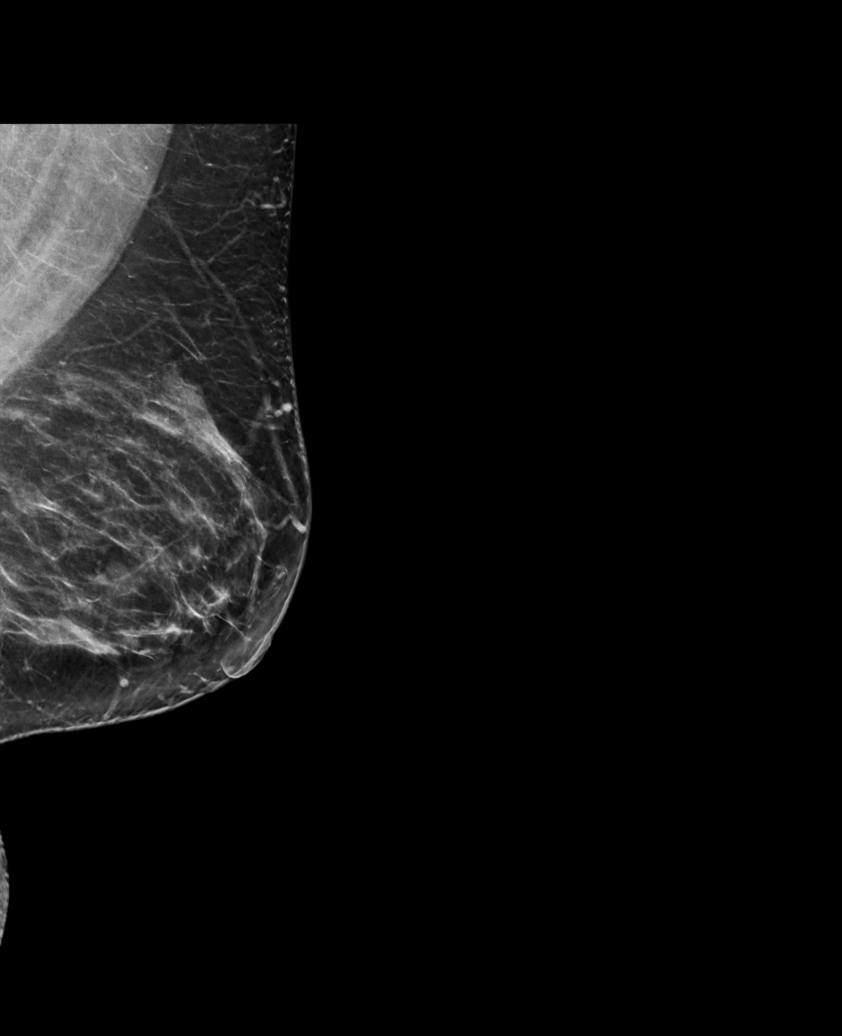

[R CC tomo · tomo slice 37/74.0]
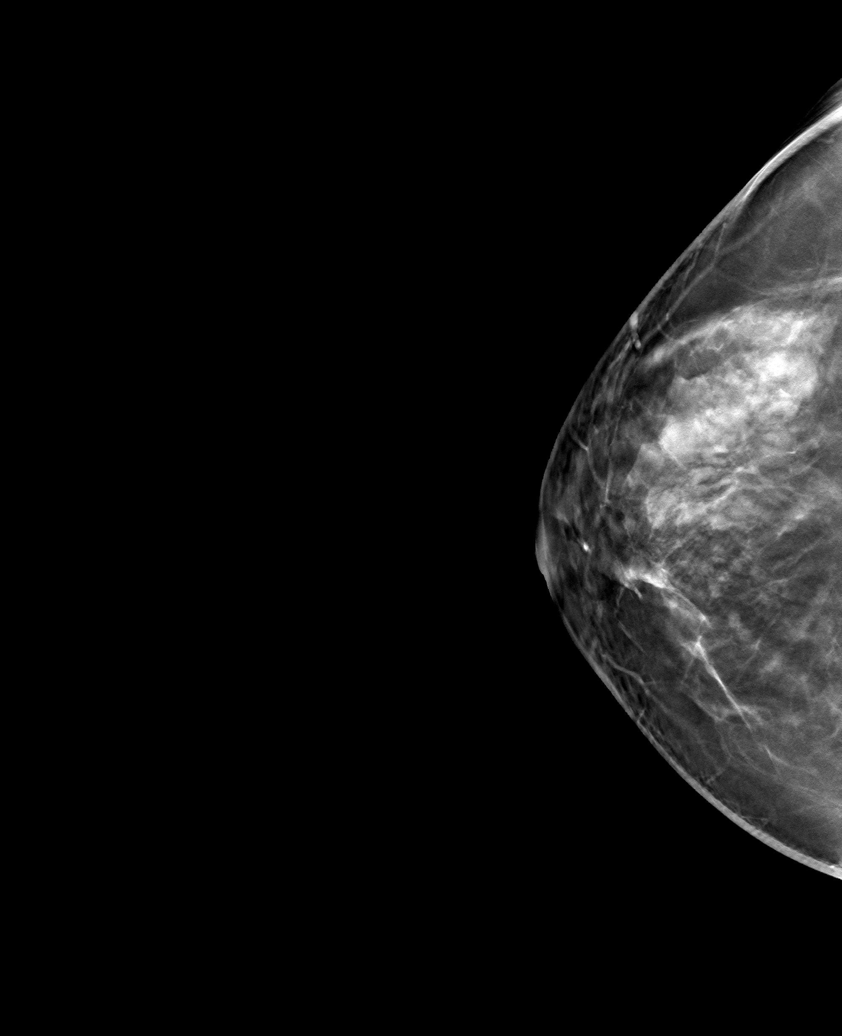

[6 of 30 positions shown; findings below may reference images not displayed]

ACR Breast Density Category c: The breast tissue is heterogeneously
dense, which may obscure small masses.
FINDINGS: There are no findings suspicious for malignancy.
IMPRESSION: No mammographic evidence of malignancy. A result letter of this
screening mammogram will be mailed directly to the patient.

RECOMMENDATION:
Screening mammogram in one year. (Code:Q3-W-BC3)

BI-RADS CATEGORY  1: Negative.

## 2023-07-07 ENCOUNTER — Other Ambulatory Visit: Payer: Self-pay | Admitting: Family

## 2023-07-07 DIAGNOSIS — Z1231 Encounter for screening mammogram for malignant neoplasm of breast: Secondary | ICD-10-CM

## 2023-07-28 ENCOUNTER — Ambulatory Visit
Admission: RE | Admit: 2023-07-28 | Discharge: 2023-07-28 | Disposition: A | Discharge status: Home / Self Care | Source: Ambulatory Visit | Attending: Family | Admitting: Family

## 2023-07-28 DIAGNOSIS — Z1231 Encounter for screening mammogram for malignant neoplasm of breast: Secondary | ICD-10-CM

## 2024-02-05 ENCOUNTER — Ambulatory Visit: Admitting: Radiology
